# Patient Record
Sex: Male | Born: 2000
Health system: Southern US, Community
[De-identification: ages and names within clinical notes are randomized; demographics above are authoritative.]

## PROBLEM LIST (undated history)

## (undated) DIAGNOSIS — G40909 Epilepsy, unspecified, not intractable, without status epilepticus: Secondary | ICD-10-CM

## (undated) DIAGNOSIS — Z974 Presence of external hearing-aid: Secondary | ICD-10-CM

## (undated) DIAGNOSIS — H905 Unspecified sensorineural hearing loss: Secondary | ICD-10-CM

## (undated) DIAGNOSIS — R569 Unspecified convulsions: Secondary | ICD-10-CM

## (undated) HISTORY — PX: TYMPANOSTOMY TUBE PLACEMENT: SHX32

---
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
## 2021-01-04 ENCOUNTER — Encounter (INDEPENDENT_AMBULATORY_CARE_PROVIDER_SITE_OTHER): Payer: Self-pay

## 2021-01-10 DIAGNOSIS — J1289 Other viral pneumonia: Secondary | ICD-10-CM | POA: Diagnosis not present

## 2021-03-29 ENCOUNTER — Other Ambulatory Visit (HOSPITAL_COMMUNITY): Payer: Self-pay

## 2021-03-29 DIAGNOSIS — H903 Sensorineural hearing loss, bilateral: Secondary | ICD-10-CM | POA: Diagnosis not present

## 2021-03-29 DIAGNOSIS — Z713 Dietary counseling and surveillance: Secondary | ICD-10-CM | POA: Diagnosis not present

## 2021-03-29 DIAGNOSIS — Z68.41 Body mass index (BMI) pediatric, greater than or equal to 95th percentile for age: Secondary | ICD-10-CM | POA: Diagnosis not present

## 2021-03-29 DIAGNOSIS — G40209 Localization-related (focal) (partial) symptomatic epilepsy and epileptic syndromes with complex partial seizures, not intractable, without status epilepticus: Secondary | ICD-10-CM | POA: Diagnosis not present

## 2021-03-29 DIAGNOSIS — Z0000 Encounter for general adult medical examination without abnormal findings: Secondary | ICD-10-CM | POA: Diagnosis not present

## 2021-03-29 MED ORDER — MOMETASONE FUROATE 0.1 % EX SOLN
1.0000 "application " | CUTANEOUS | 5 refills | Status: AC
  Filled 2021-03-29: qty 60, 25d supply, fill #0
  Filled 2021-03-30: qty 60, 30d supply, fill #0

## 2021-03-30 ENCOUNTER — Other Ambulatory Visit (HOSPITAL_COMMUNITY): Payer: Self-pay

## 2021-04-02 ENCOUNTER — Other Ambulatory Visit (HOSPITAL_COMMUNITY): Payer: Self-pay

## 2021-08-30 ENCOUNTER — Other Ambulatory Visit (HOSPITAL_COMMUNITY): Payer: Self-pay

## 2021-11-24 DIAGNOSIS — L0501 Pilonidal cyst with abscess: Secondary | ICD-10-CM | POA: Diagnosis not present

## 2021-12-01 DIAGNOSIS — Z4801 Encounter for change or removal of surgical wound dressing: Secondary | ICD-10-CM | POA: Diagnosis not present

## 2021-12-03 DIAGNOSIS — Z4801 Encounter for change or removal of surgical wound dressing: Secondary | ICD-10-CM | POA: Diagnosis not present

## 2021-12-10 DIAGNOSIS — Z4801 Encounter for change or removal of surgical wound dressing: Secondary | ICD-10-CM | POA: Diagnosis not present

## 2021-12-12 DIAGNOSIS — Z4801 Encounter for change or removal of surgical wound dressing: Secondary | ICD-10-CM | POA: Diagnosis not present

## 2021-12-14 DIAGNOSIS — Z4801 Encounter for change or removal of surgical wound dressing: Secondary | ICD-10-CM | POA: Diagnosis not present

## 2021-12-19 DIAGNOSIS — Z4801 Encounter for change or removal of surgical wound dressing: Secondary | ICD-10-CM | POA: Diagnosis not present

## 2021-12-21 DIAGNOSIS — Z4801 Encounter for change or removal of surgical wound dressing: Secondary | ICD-10-CM | POA: Diagnosis not present

## 2021-12-24 DIAGNOSIS — Z4801 Encounter for change or removal of surgical wound dressing: Secondary | ICD-10-CM | POA: Diagnosis not present

## 2021-12-26 DIAGNOSIS — Z4801 Encounter for change or removal of surgical wound dressing: Secondary | ICD-10-CM | POA: Diagnosis not present

## 2021-12-28 DIAGNOSIS — Z4801 Encounter for change or removal of surgical wound dressing: Secondary | ICD-10-CM | POA: Diagnosis not present

## 2021-12-31 DIAGNOSIS — Z4801 Encounter for change or removal of surgical wound dressing: Secondary | ICD-10-CM | POA: Diagnosis not present

## 2022-01-02 DIAGNOSIS — Z4801 Encounter for change or removal of surgical wound dressing: Secondary | ICD-10-CM | POA: Diagnosis not present

## 2022-01-02 DIAGNOSIS — L0501 Pilonidal cyst with abscess: Secondary | ICD-10-CM | POA: Diagnosis not present

## 2022-01-11 DIAGNOSIS — L0501 Pilonidal cyst with abscess: Secondary | ICD-10-CM | POA: Diagnosis not present

## 2022-01-15 DIAGNOSIS — L988 Other specified disorders of the skin and subcutaneous tissue: Secondary | ICD-10-CM | POA: Diagnosis not present

## 2022-06-21 DIAGNOSIS — J069 Acute upper respiratory infection, unspecified: Secondary | ICD-10-CM | POA: Diagnosis not present

## 2022-10-15 DIAGNOSIS — Z7189 Other specified counseling: Secondary | ICD-10-CM | POA: Diagnosis not present

## 2022-10-15 DIAGNOSIS — Z1330 Encounter for screening examination for mental health and behavioral disorders, unspecified: Secondary | ICD-10-CM | POA: Diagnosis not present

## 2023-02-07 DIAGNOSIS — H9072 Mixed conductive and sensorineural hearing loss, unilateral, left ear, with unrestricted hearing on the contralateral side: Secondary | ICD-10-CM | POA: Diagnosis not present

## 2023-02-07 DIAGNOSIS — H9041 Sensorineural hearing loss, unilateral, right ear, with unrestricted hearing on the contralateral side: Secondary | ICD-10-CM | POA: Diagnosis not present

## 2023-10-27 DIAGNOSIS — J069 Acute upper respiratory infection, unspecified: Secondary | ICD-10-CM | POA: Diagnosis not present

## 2023-11-24 DIAGNOSIS — Z1330 Encounter for screening examination for mental health and behavioral disorders, unspecified: Secondary | ICD-10-CM | POA: Diagnosis not present

## 2023-11-24 DIAGNOSIS — Z23 Encounter for immunization: Secondary | ICD-10-CM | POA: Diagnosis not present

## 2023-11-24 DIAGNOSIS — S80212A Abrasion, left knee, initial encounter: Secondary | ICD-10-CM | POA: Diagnosis not present

## 2024-07-19 DIAGNOSIS — Z1330 Encounter for screening examination for mental health and behavioral disorders, unspecified: Secondary | ICD-10-CM | POA: Diagnosis not present

## 2024-07-19 DIAGNOSIS — J069 Acute upper respiratory infection, unspecified: Secondary | ICD-10-CM | POA: Diagnosis not present
